# Patient Record
Sex: Male | Born: 1980 | Race: White | Hispanic: No | Marital: Single | Smoking: Never smoker
Health system: Southern US, Community
[De-identification: ages and names within clinical notes are randomized; demographics above are authoritative.]

## PROBLEM LIST (undated history)

## (undated) DIAGNOSIS — T7840XA Allergy, unspecified, initial encounter: Secondary | ICD-10-CM

## (undated) HISTORY — PX: CARPAL TUNNEL RELEASE: SHX101

## (undated) HISTORY — DX: Allergy, unspecified, initial encounter: T78.40XA

---

## 2007-04-19 ENCOUNTER — Inpatient Hospital Stay (HOSPITAL_COMMUNITY): Admission: EM | Admit: 2007-04-19 | Discharge: 2007-04-30 | Payer: Self-pay | Admitting: Emergency Medicine

## 2008-08-21 IMAGING — CT CT L SPINE W/O CM
4 of 6 series · 16 of 33 positions shown, 18 images · non-contrast
Comparison: Lumbar spine radiographs, 04/19/07.

CLINICAL DATA: Evaluate L1 compression fracture.
 LUMBAR SPINE CT WITHOUT CONTRAST ? 04/19/07:
TECHNIQUE: Multidetector CT imaging of the lumbar spine was performed.  Multiplanar CT image reconstructions were also generated.   Lumbar spine CT was performed from the inferior aspect of T11 through the superior aspect of L3, for the evaluation of an L1 fracture.

[Series 2: l-spine helical · axial · 0.27mm/px · z∈[-250,-162]mm · 5 of 53 slices shown, 7 images]
[im 9/53  soft-tissue]
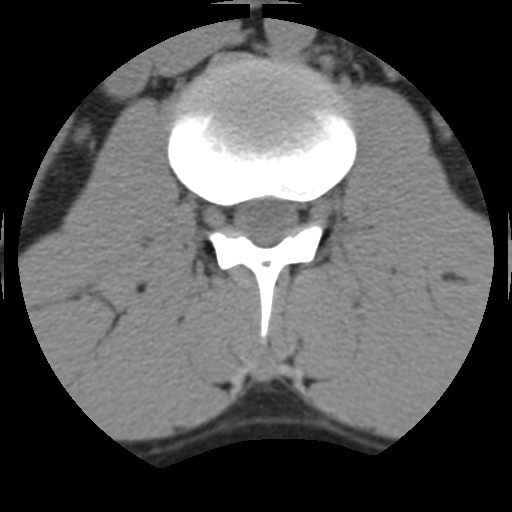
[im 9/53  bone]
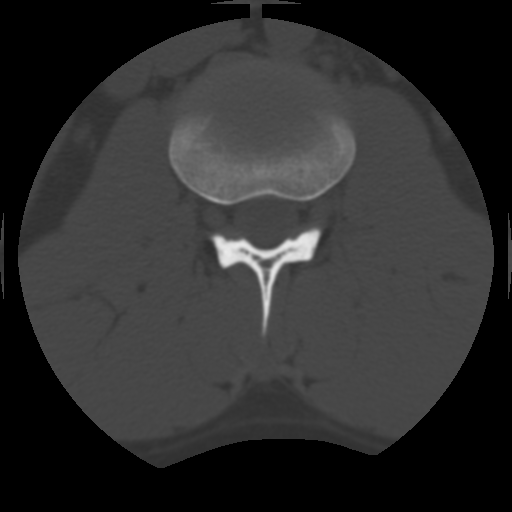
[im 18/53  bone]
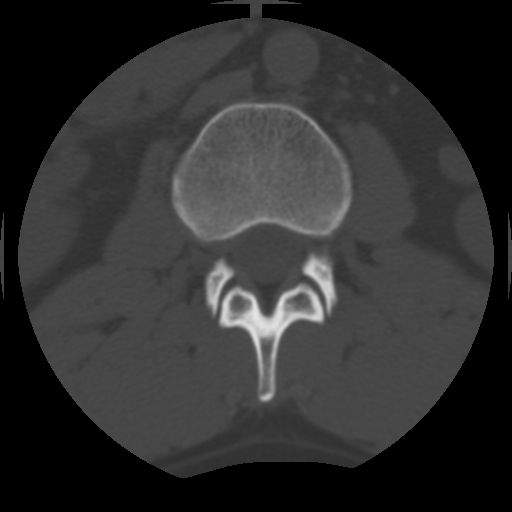
[im 27/53  bone]
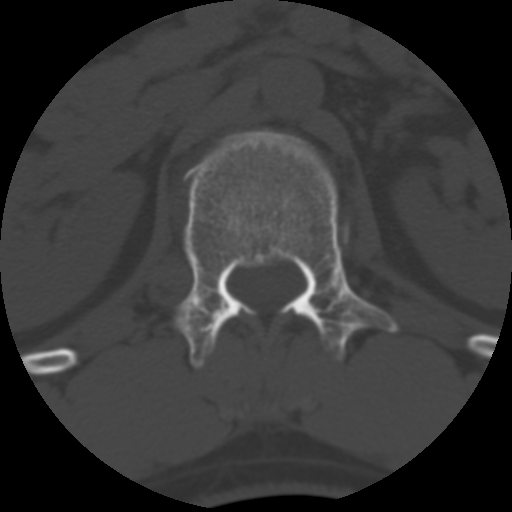
[im 35/53  bone]
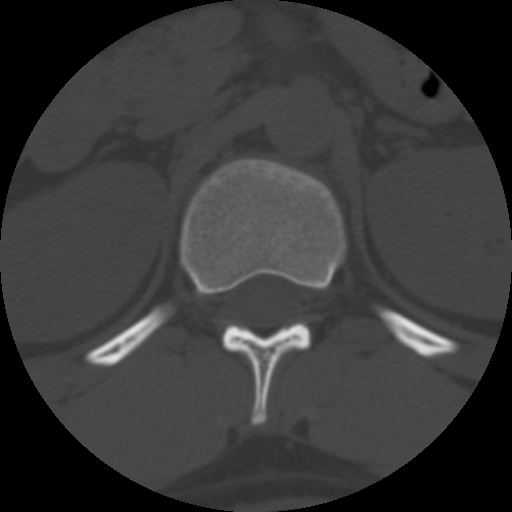
[im 44/53  soft-tissue]
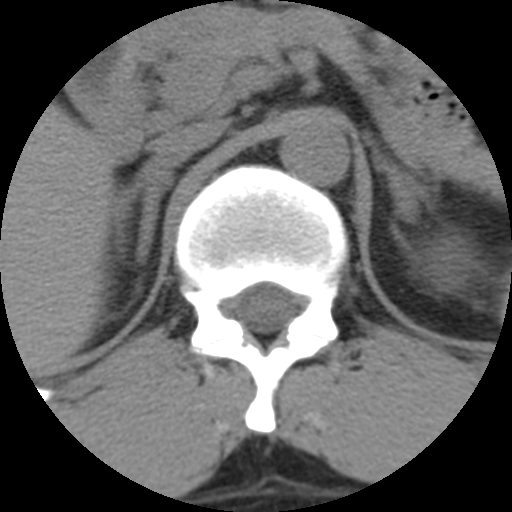
[im 44/53  bone]
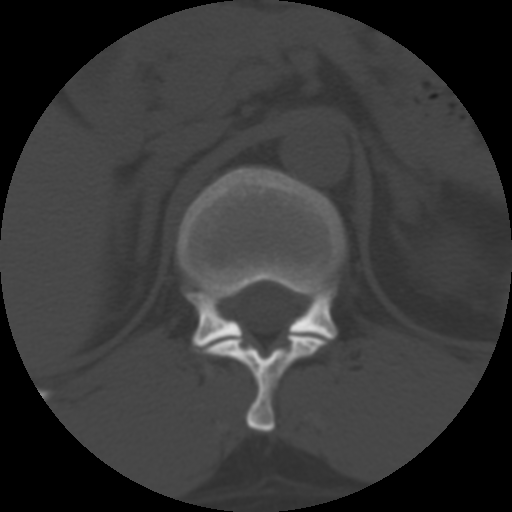

[Series 3: recon 2: l-spine helical · axial · 0.27mm/px · z∈[-250,-162]mm · 5 of 53 slices shown]
[im 9/53  bone]
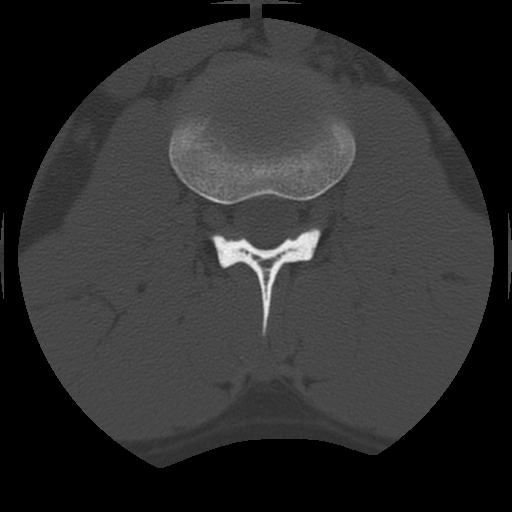
[im 18/53  bone]
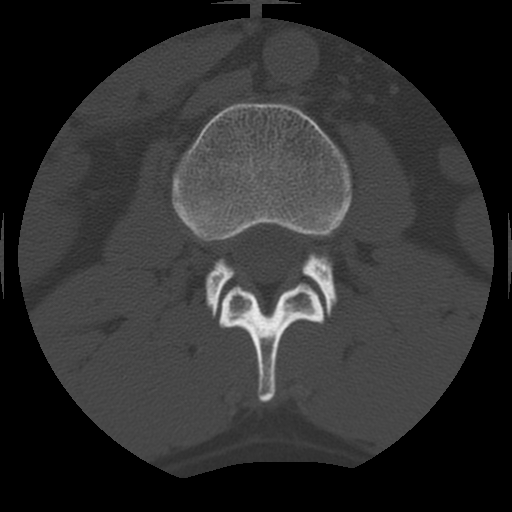
[im 27/53  bone]
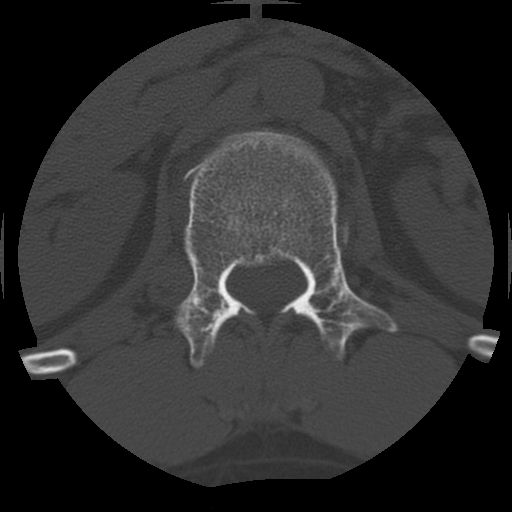
[im 35/53  bone]
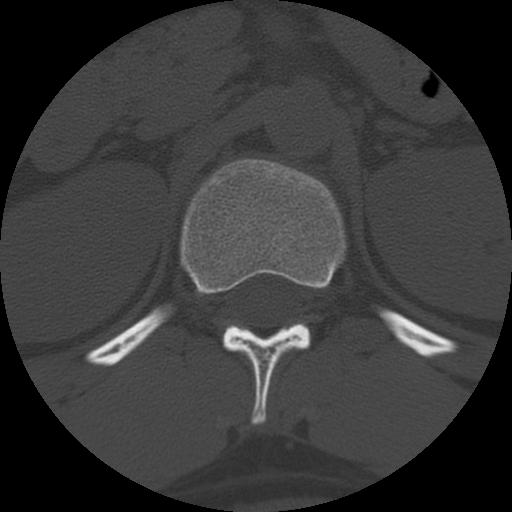
[im 44/53  bone]
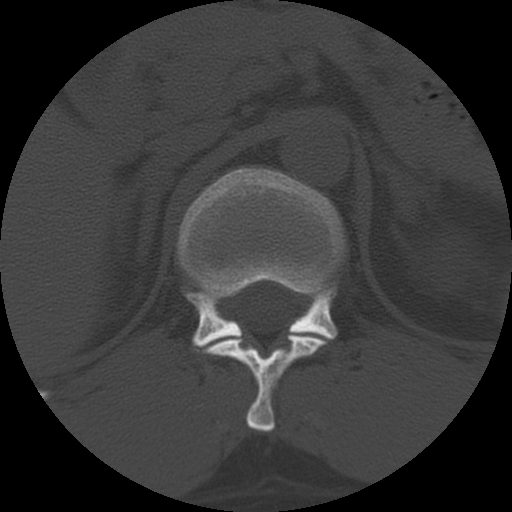

[Series 400: cor l-spine bone · coronal · 0.27mm/px · 1 of 46 slices shown]
[im 23/46  bone]
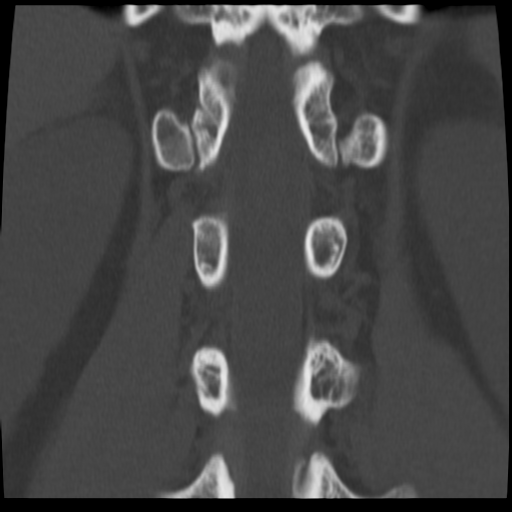

[Series 401: sag l-spine bone · sagittal · 0.27mm/px · 5 of 34 slices shown]
[im 6/34  bone]
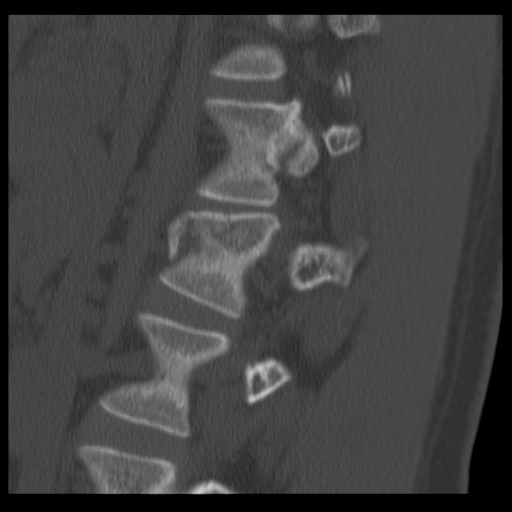
[im 12/34  bone]
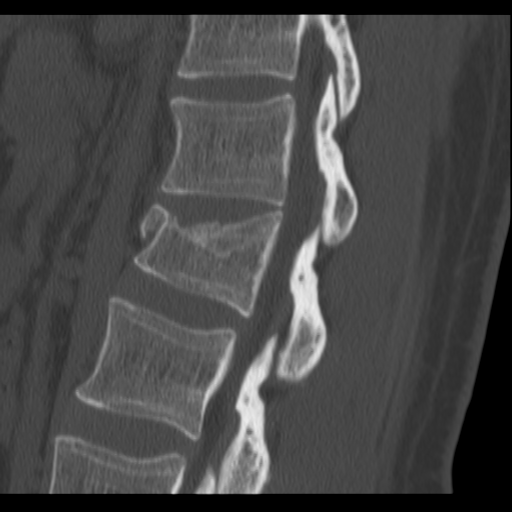
[im 17/34  bone]
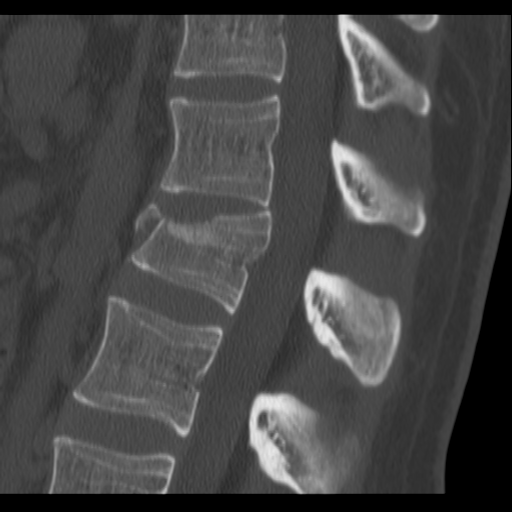
[im 23/34  bone]
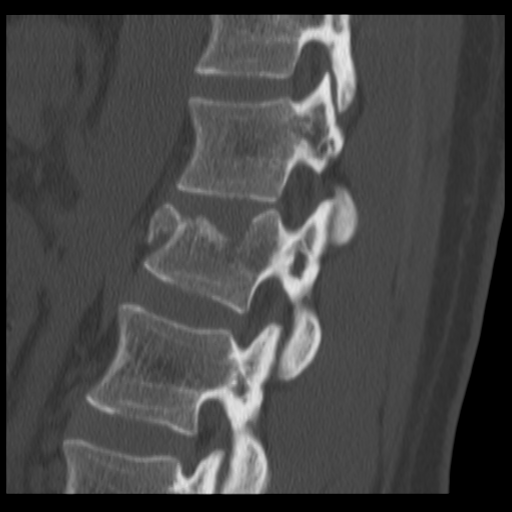
[im 28/34  bone]
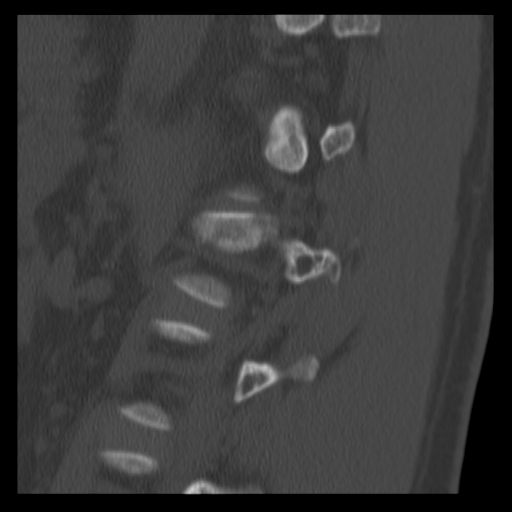

[16 of 33 positions shown; findings below may reference images not displayed]

FINDINGS: There is a comminuted fracture of the superior end-plate of L1. There is retropulsion of the posterior cortex of the L1 vertebral body, by approximately 3 mm.  The L1 vertebral body height anteriorly measures approximately 16 mm, compared to 25.5 mm of T12.  The fracture does not extend into the posterior elements of L1.
 On soft tissue windows, there is no evidence of an epidural hematoma.  No hematoma is identified in the soft tissues anterior to the L1 vertebral body.
IMPRESSION: Comminuted fracture of the superior end-plate of L1 as described above with 3 mm of retropulsion of the posterior cortex.

## 2010-08-22 NOTE — Discharge Summary (Signed)
NAME:  Oscar Le, Oscar Le NO.:  [id number]   MEDICAL RECORD NO.:  [id number]          PATIENT TYPE:  INP   LOCATION:  3005                         FACILITY:  [facility]   PHYSICIAN:  Hewitt Shorts, M.D.DATE OF BIRTH:  May 12, 1980   DATE OF ADMISSION:  04/19/2007  DATE OF DISCHARGE:  04/30/2007                               DISCHARGE SUMMARY   ADMISSION HISTORY AND PHYSICAL EXAMINATION:  The patient is a 30-year-  old man who fell from a ladder while power washing a house.  He had  immediate back pain.  He was evaluated in the [facility]  Emergency Room and found to have an L1 wedge compression fracture with  more loss of height anteriorly than posteriorly.  General examination  was notable only for tenderness in the upper lumbar region posteriorly.  Strength was at least 4 or 5 in the iliopsoas, but they gave away due to  pain.  The remainder of the strength of the upper and lower extremities  was 5/5.   HOSPITAL COURSE:  The patient was admitted, placed on a strict bedrest  with the head of bed flat and logrolling side to side every 2 hours.  He  was started on Lovenox 40 mg subcutaneous second day after injury.  He  had some difficulties with constipation that were treated with Dulcolax  tablets and Fleet Enema, and the constipation resolved.  After 1 week of  bedrest, we began to mobilize the patient.  He was fitted for a Jewett  brace, and instructions were given to the staff that he be donning and  doffing the brace in bed with the head of bed flat.  Physical therapy  was consulted and occupational therapy was consulted, and the patient  has made steady progress with mobility and activities.  He is at this  point able to don and doff the brace in bed.  He is much more  comfortable.  He is walking both on the floor as well as up steps.  He  does use a rolling walker, but I have explained to him that he needs to  taper and discontinue the use of rolling  walker within 1 week.  He has  been using medications including Percocet and Valium, and he has been  given prescriptions, but he has been told to taper the use of the  medications over the next week such that 1 week from now he is using  nothing more than Tylenol.  He has been given prescription of Percocet  with instructions to take 1/2 tablet to 1 tablet or 2 tablets every 4 to  6 hours p.r.n. for pain, 50 tablets prescribed, no refills.  He was  prescribed Valium 5 mg t.i.d. p.r.n. muscle spasms, 40 tablets and no  refills.  He was also instructed to take Caltrate 1 tablet b.i.d. to  help with the healing of his fracture.  He is to wear his Jewett brace  whenever he is up and about.  He is to steadily increase his activities.  He is to not flex in the  thoracolumbar region at any time.  He is to  follow up in my office in 3 weeks with AP and lateral lumbar spine x-  rays or sooner if he has increased difficulties.   DISCHARGE DIAGNOSIS:  L1 compression fracture, traumatic.      Hewitt Shorts, M.D.  Electronically Signed     RWN/MEDQ  D:  04/30/2007  T:  04/30/2007  Job:  [id number]
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
## 2010-12-28 LAB — CBC
HCT: 44.8
Hemoglobin: 15.2
MCHC: 33.9
MCV: 90.8
Platelets: 244
RBC: 4.93
RDW: 13.3
WBC: 18.1 — ABNORMAL HIGH

## 2010-12-28 LAB — DIFFERENTIAL
Basophils Absolute: 0.1
Basophils Relative: 1
Eosinophils Absolute: 0
Eosinophils Relative: 0
Lymphocytes Relative: 12
Lymphs Abs: 2.1
Monocytes Absolute: 1.3 — ABNORMAL HIGH
Monocytes Relative: 7
Neutro Abs: 14.6 — ABNORMAL HIGH
Neutrophils Relative %: 81 — ABNORMAL HIGH

## 2010-12-28 LAB — BASIC METABOLIC PANEL
BUN: 8
CO2: 25
Calcium: 9.2
Chloride: 110
Creatinine, Ser: 0.84
GFR calc non Af Amer: >60
Glucose, Bld: 95
Potassium: 3.6
Sodium: 144

## 2014-07-20 ENCOUNTER — Emergency Department (INDEPENDENT_AMBULATORY_CARE_PROVIDER_SITE_OTHER)
Admission: EM | Admit: 2014-07-20 | Discharge: 2014-07-20 | Disposition: A | Discharge status: Home / Self Care | Payer: Worker's Compensation | Source: Home / Self Care | Attending: Family Medicine | Admitting: Family Medicine

## 2014-07-20 ENCOUNTER — Encounter (HOSPITAL_COMMUNITY): Payer: Self-pay | Admitting: Emergency Medicine

## 2014-07-20 DIAGNOSIS — M5442 Lumbago with sciatica, left side: Secondary | ICD-10-CM

## 2014-07-20 MED ORDER — ETODOLAC 500 MG PO TABS: 500.0000 mg | ORAL_TABLET | Freq: Two times a day (BID) | ORAL | Status: DC

## 2014-07-20 MED ORDER — DIAZEPAM 5 MG PO TABS: 5.0000 mg | ORAL_TABLET | Freq: Two times a day (BID) | ORAL | Status: DC
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
## 2014-07-20 NOTE — Discharge Instructions (Signed)
Do not take the Valium while you're working.  Sciatica Sciatica is pain, weakness, numbness, or tingling along the path of the sciatic nerve. The nerve starts in the lower back and runs down the back of each leg. The nerve controls the muscles in the lower leg and in the back of the knee, while also providing sensation to the back of the thigh, lower leg, and the sole of your foot. Sciatica is a symptom of another medical condition. For instance, nerve damage or certain conditions, such as a herniated disk or bone spur on the spine, pinch or put pressure on the sciatic nerve. This causes the pain, weakness, or other sensations normally associated with sciatica. Generally, sciatica only affects one side of the body. CAUSES   Herniated or slipped disc.  Degenerative disk disease.  A pain disorder involving the narrow muscle in the buttocks (piriformis syndrome).  Pelvic injury or fracture.  Pregnancy.  Tumor (rare). SYMPTOMS  Symptoms can vary from mild to very severe. The symptoms usually travel from the low back to the buttocks and down the back of the leg. Symptoms can include:  Mild tingling or dull aches in the lower back, leg, or hip.  Numbness in the back of the calf or sole of the foot.  Burning sensations in the lower back, leg, or hip.  Sharp pains in the lower back, leg, or hip.  Leg weakness.  Severe back pain inhibiting movement. These symptoms may get worse with coughing, sneezing, laughing, or prolonged sitting or standing. Also, being overweight may worsen symptoms. DIAGNOSIS  Your caregiver will perform a physical exam to look for common symptoms of sciatica. He or she may ask you to do certain movements or activities that would trigger sciatic nerve pain. Other tests may be performed to find the cause of the sciatica. These may include:  Blood tests.  X-rays.  Imaging tests, such as an MRI or CT scan. TREATMENT  Treatment is directed at the cause of the  sciatic pain. Sometimes, treatment is not necessary and the pain and discomfort goes away on its own. If treatment is needed, your caregiver may suggest:  Over-the-counter medicines to relieve pain.  Prescription medicines, such as anti-inflammatory medicine, muscle relaxants, or narcotics.  Applying heat or ice to the painful area.  Steroid injections to lessen pain, irritation, and inflammation around the nerve.  Reducing activity during periods of pain.  Exercising and stretching to strengthen your abdomen and improve flexibility of your spine. Your caregiver may suggest losing weight if the extra weight makes the back pain worse.  Physical therapy.  Surgery to eliminate what is pressing or pinching the nerve, such as a bone spur or part of a herniated disk. HOME CARE INSTRUCTIONS   Only take over-the-counter or prescription medicines for pain or discomfort as directed by your caregiver.  Apply ice to the affected area for 20 minutes, 3-4 times a day for the first 48-72 hours. Then try heat in the same way.  Exercise, stretch, or perform your usual activities if these do not aggravate your pain.  Attend physical therapy sessions as directed by your caregiver.  Keep all follow-up appointments as directed by your caregiver.  Do not wear high heels or shoes that do not provide proper support.  Check your mattress to see if it is too soft. A firm mattress may lessen your pain and discomfort. SEEK IMMEDIATE MEDICAL CARE IF:   You lose control of your bowel or bladder (incontinence).  You  have increasing weakness in the lower back, pelvis, buttocks, or legs.  You have redness or swelling of your back.  You have a burning sensation when you urinate.  You have pain that gets worse when you lie down or awakens you at night.  Your pain is worse than you have experienced in the past.  Your pain is lasting longer than 4 weeks.  You are suddenly losing weight without  reason. MAKE SURE YOU:  Understand these instructions.  Will watch your condition.  Will get help right away if you are not doing well or get worse. Document Released: 03/20/2001 Document Revised: 09/25/2011 Document Reviewed: 08/05/2011 [facility] Patient Information 2015 [place], [place]. This information is not intended to replace advice given to you by your health care provider. Make sure you discuss any questions you have with your health care provider.  Back Exercises Back exercises help treat and prevent back injuries. The goal of back exercises is to increase the strength of your abdominal and back muscles and the flexibility of your back. These exercises should be started when you no longer have back pain. Back exercises include:  Pelvic Tilt. Lie on your back with your knees bent. Tilt your pelvis until the lower part of your back is against the floor. Hold this position 5 to 10 sec and repeat 5 to 10 times.  Knee to Chest. Pull first 1 knee up against your chest and hold for 20 to 30 seconds, repeat this with the other knee, and then both knees. This may be done with the other leg straight or bent, whichever feels better.  Sit-Ups or Curl-Ups. Bend your knees 90 degrees. Start with tilting your pelvis, and do a partial, slow sit-up, lifting your trunk only 30 to 45 degrees off the floor. Take at least 2 to 3 seconds for each sit-up. Do not do sit-ups with your knees out straight. If partial sit-ups are difficult, simply do the above but with only tightening your abdominal muscles and holding it as directed.  Hip-Lift. Lie on your back with your knees flexed 90 degrees. Push down with your feet and shoulders as you raise your hips a couple inches off the floor; hold for 10 seconds, repeat 5 to 10 times.  Back arches. Lie on your stomach, propping yourself up on bent elbows. Slowly press on your hands, causing an arch in your low back. Repeat 3 to 5 times. Any initial stiffness and  discomfort should lessen with repetition over time.  Shoulder-Lifts. Lie face down with arms beside your body. Keep hips and torso pressed to floor as you slowly lift your head and shoulders off the floor. Do not overdo your exercises, especially in the beginning. Exercises may cause you some mild back discomfort which lasts for a few minutes; however, if the pain is more severe, or lasts for more than 15 minutes, do not continue exercises until you see your caregiver. Improvement with exercise therapy for back problems is slow.  See your caregivers for assistance with developing a proper back exercise program. Document Released: 05/03/2004 Document Revised: 06/18/2011 Document Reviewed: 01/25/2011 [facility] Patient Information 2015 [place], [place]. This information is not intended to replace advice given to you by your health care provider. Make sure you discuss any questions you have with your health care provider.  Back Pain, Adult Back pain is very common. The pain often gets better over time. The cause of back pain is usually not dangerous. Most people can learn to manage their back  pain on their own.  HOME CARE   Stay active. Start with short walks on flat ground if you can. Try to walk farther each day.  Do not sit, drive, or stand in one place for more than 30 minutes. Do not stay in bed.  Do not avoid exercise or work. Activity can help your back heal faster.  Be careful when you bend or lift an object. Bend at your knees, keep the object close to you, and do not twist.  Sleep on a firm mattress. Lie on your side, and bend your knees. If you lie on your back, put a pillow under your knees.  Only take medicines as told by your doctor.  Put ice on the injured area.  Put ice in a plastic bag.  Place a towel between your skin and the bag.  Leave the ice on for 15-20 minutes, 03-04 times a day for the first 2 to 3 days. After that, you can switch between ice and heat packs.  Ask  your doctor about back exercises or massage.  Avoid feeling anxious or stressed. Find good ways to deal with stress, such as exercise. GET HELP RIGHT AWAY IF:   Your pain does not go away with rest or medicine.  Your pain does not go away in 1 week.  You have new problems.  You do not feel well.  The pain spreads into your legs.  You cannot control when you poop (bowel movement) or pee (urinate).  Your arms or legs feel weak or lose feeling (numbness).  You feel sick to your stomach (nauseous) or throw up (vomit).  You have belly (abdominal) pain.  You feel like you may pass out (faint). MAKE SURE YOU:   Understand these instructions.  Will watch your condition.  Will get help right away if you are not doing well or get worse. Document Released: 09/12/2007 Document Revised: 06/18/2011 Document Reviewed: 07/28/2013 [facility] Patient Information 2015 [place]. This information is not intended to replace advice given to you by your health care provider. Make sure you discuss any questions you have with your health care provider.
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
## 2021-11-17 ENCOUNTER — Encounter (HOSPITAL_BASED_OUTPATIENT_CLINIC_OR_DEPARTMENT_OTHER): Payer: Self-pay

## 2021-11-17 ENCOUNTER — Other Ambulatory Visit: Payer: Self-pay

## 2021-11-17 ENCOUNTER — Emergency Department (HOSPITAL_BASED_OUTPATIENT_CLINIC_OR_DEPARTMENT_OTHER)
Admission: EM | Admit: 2021-11-17 | Discharge: 2021-11-17 | Disposition: A | Discharge status: Home / Self Care | Payer: No Typology Code available for payment source | Attending: Emergency Medicine | Admitting: Emergency Medicine

## 2021-11-17 DIAGNOSIS — T7840XA Allergy, unspecified, initial encounter: Secondary | ICD-10-CM | POA: Diagnosis present

## 2021-11-17 MED ORDER — IBUPROFEN 400 MG PO TABS
600.0000 mg | ORAL_TABLET | Freq: Once | ORAL | Status: AC
  Administered 2021-11-17: 600 mg via ORAL
  Filled 2021-11-17: qty 1

## 2021-11-17 MED ORDER — EPINEPHRINE 0.3 MG/0.3ML IJ SOAJ: 0.3000 mg | INTRAMUSCULAR | 2 refills | Status: DC | PRN

## 2021-11-17 NOTE — ED Notes (Signed)
Discharge instructions and follow up care reviewed and explained. Pt verbalized understanding and states he has had training on using Epipen in the past and feels comfortable using an Epipen. Pt caox4 and ambulatory on d/c.

## 2021-11-17 NOTE — Discharge Instructions (Addendum)
You can continue taking Benadryl 25 mg every 6 hours as needed for the next 2 days for rash and swelling.  You can use ibuprofen and Tylenol for headache.  You can also apply ice to your forehead if this helps with the swelling and pain.  We talked about when to use the EpiPen in the future.  This would include when you have an allergic reaction, associated with lightheadedness, feeling like passing out, hives all over your body, or any throat or lip swelling or difficulty breathing.  If you are giving yourself this injection, you should also be returning to the ER immediately.

## 2021-11-17 NOTE — ED Provider Notes (Signed)
[facility] EMERGENCY DEPT Provider Note   CSN: [id number] Arrival date & time: 11/17/21  1158     History  Chief Complaint  Patient presents with   Insect Bite    Oscar Le is a 41 y.o. male presenting to emergency department with concern for allergic reaction to insect sting, reports he was stung by a hornet about 45 minutes before arrival on his forehead.  He reports he developed an swelling around the site, and also headache, but then his employer was concerned when the patient began to have nausea.  He was referred to the ED.  The patient ports has been stung in the past by yellow jackets bees, she developed localized swelling that can last a week or 2, but has never had throat swelling or lip swelling or anaphylaxis.  He does not carry an EpiPen.  He did take Benadryl prior to arrival.  HPI     Home Medications Prior to Admission medications   Medication Sig Start Date End Date Taking? Authorizing Provider  diazepam (VALIUM) 5 MG tablet Take 1 tablet (5 mg total) by mouth 2 (two) times daily. 07/20/14   Excell Seltzer, Adrian Blackwater, PA-C  diphenhydrAMINE (SOMINEX) 25 MG tablet Take 25 mg by mouth at bedtime as needed for allergies or sleep.    [provider]  etodolac (LODINE) 500 MG tablet Take 1 tablet (500 mg total) by mouth 2 (two) times daily. 07/20/14   Graylon Good, PA-C      Allergies    Codeine    Review of Systems   Review of Systems  Physical Exam Updated Vital Signs BP (!) 113/98 (BP Location: Right Arm)   Pulse 84   Temp 98.1 F (36.7 C) (Oral)   Resp 16   Ht 5\' 10"  (1.778 m)   Wt 87.1 kg   SpO2 100%   BMI 27.55 kg/m  Physical Exam Constitutional:      General: He is not in acute distress.    Comments: Small amount of erythema surrounding frontal forehead sting site Patient has red birth mark beneath right eye  HENT:     Head: Normocephalic and atraumatic.  Eyes:     Conjunctiva/sclera: Conjunctivae normal.     Pupils: Pupils  are equal, round, and reactive to light.  Neck:     Comments: Oropharynx non-erythematous.  No tonsillar swelling or exudate.  No uvular deviation.  No drooling. No brawny edema. No stridor. Voice is not muffled.  Cardiovascular:     Rate and Rhythm: Normal rate and regular rhythm.  Pulmonary:     Effort: Pulmonary effort is normal. No respiratory distress.  Abdominal:     General: There is no distension.     Tenderness: There is no abdominal tenderness.  Skin:    General: Skin is warm and dry.  Neurological:     General: No focal deficit present.     Mental Status: He is alert. Mental status is at baseline.  Psychiatric:        Mood and Affect: Mood normal.        Behavior: Behavior normal.     ED Results / Procedures / Treatments   Labs (all labs ordered are listed, but only abnormal results are displayed) Labs Reviewed - No data to display  EKG None  Radiology No results found.  Procedures Procedures    Medications Ordered in ED Medications - No data to display  ED Course/ Medical Decision Making/ A&P  Medical Decision Making  Patient is here with concern for allergic reaction to bee sting.  He does report nausea and vomiting in association with his skin swelling, which would meet the requirements for anaphylaxis.  He does not have hypotension or evidence of airway involvement.  I do think it be reasonable to prescribe him an EpiPen and discussed the circumstances that he would want to use this, and he will also be referred back to the allergy testing center to determine the extent of his allergies.  It is possible to develop hypersensitivity to bee venom after multiple stings.  I recommend he continue Benadryl in the meantime at home.  He was observed for period of 2 and half hours in the ED with absolutely no worsening of his symptoms, no evidence of developing hives, no airway involvement.  I felt he was safe for discharge at this  time        Final Clinical Impression(s) / ED Diagnoses Final diagnoses:  Allergic reaction, initial encounter    Rx / DC Orders ED Discharge Orders     None         Lizzie An, Kermit Balo, MD 11/17/21 [phone]

## 2021-11-17 NOTE — ED Triage Notes (Signed)
Patient here POV from Home.  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   No SOB. Discomfort to Area as well as Nausea.   NAD Noted during Triage. A&Ox4. GCS 15. Ambulatory.

## 2022-12-24 ENCOUNTER — Ambulatory Visit
Admission: EM | Admit: 2022-12-24 | Discharge: 2022-12-24 | Disposition: A | Discharge status: Home / Self Care | Payer: Self-pay | Attending: Internal Medicine | Admitting: Internal Medicine

## 2022-12-24 DIAGNOSIS — R051 Acute cough: Secondary | ICD-10-CM | POA: Insufficient documentation

## 2022-12-24 DIAGNOSIS — J208 Acute bronchitis due to other specified organisms: Secondary | ICD-10-CM | POA: Insufficient documentation

## 2022-12-24 DIAGNOSIS — Z1152 Encounter for screening for COVID-19: Secondary | ICD-10-CM | POA: Insufficient documentation

## 2022-12-24 DIAGNOSIS — B9689 Other specified bacterial agents as the cause of diseases classified elsewhere: Secondary | ICD-10-CM | POA: Insufficient documentation

## 2022-12-24 MED ORDER — AZITHROMYCIN 250 MG PO TABS: 250.0000 mg | ORAL_TABLET | Freq: Every day | ORAL | 0 refills | Status: DC

## 2022-12-24 MED ORDER — ALBUTEROL SULFATE HFA 108 (90 BASE) MCG/ACT IN AERS: 1.0000 | INHALATION_SPRAY | Freq: Four times a day (QID) | RESPIRATORY_TRACT | 0 refills | Status: AC | PRN

## 2022-12-24 NOTE — ED Triage Notes (Signed)
Pt presents with c/o chest congestion after cutting bushes recently. 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

## 2022-12-24 NOTE — Discharge Instructions (Addendum)
The clinic will contact you with results of your COVID test done today if positive.  Start albuterol inhaler as needed for shortness of breath. Zithromax as prescribed. Continue over the counter Mucinex or dayquil.  Lots of rest and fluids.  Please follow-up with your PCP in 2 days for recheck.  Please go to the ER for any worsening symptoms.  I hope you feel better soon!

## 2022-12-24 NOTE — ED Provider Notes (Signed)
[facility]    CSN: [id number] Arrival date & time: 12/24/22  1059      History   Chief Complaint Chief Complaint  Patient presents with   chest congestion    HPI Oscar Le is a 42 y.o. male  presents for evaluation of URI symptoms for 3 days. Patient reports associated symptoms of cough, congestion, shortness of breath. Denies N/V/D, fevers, sore throat, ear pain, body aches. Patient does not have a hx of asthma. Patient does not have a history of smoking.  Reports no sick contacts.  States he had similar symptoms a couple weeks ago that nearly resolved but then worsened again.  Pt has taken Mucinex and DayQuil OTC for symptoms. Pt has no other concerns at this time.   HPI  History reviewed. No pertinent past medical history.  There are no problems to display for this patient.   History reviewed. No pertinent surgical history.     Home Medications    Prior to Admission medications   Medication Sig Start Date End Date Taking? Authorizing Provider  albuterol (VENTOLIN HFA) 108 (90 Base) MCG/ACT inhaler Inhale 1-2 puffs into the lungs every 6 (six) hours as needed for wheezing or shortness of breath. 12/24/22  Yes Radford Pax, NP  azithromycin (ZITHROMAX) 250 MG tablet Take 1 tablet (250 mg total) by mouth daily. Take first 2 tablets together, then 1 every day until finished. 12/24/22  Yes Radford Pax, NP  diazepam (VALIUM) 5 MG tablet Take 1 tablet (5 mg total) by mouth 2 (two) times daily. 07/20/14   Excell Seltzer, Adrian Blackwater, PA-C  diphenhydrAMINE (SOMINEX) 25 MG tablet Take 25 mg by mouth at bedtime as needed for allergies or sleep.    [provider]  EPINEPHrine 0.3 mg/0.3 mL IJ SOAJ injection Inject 0.3 mg into the muscle as needed for anaphylaxis. 11/17/21   Terald Sleeper, MD  etodolac (LODINE) 500 MG tablet Take 1 tablet (500 mg total) by mouth 2 (two) times daily. 07/20/14   Graylon Good, PA-C    Family History History reviewed. No pertinent  family history.  Social History Social History   Tobacco Use   Smoking status: Never   Smokeless tobacco: Never  Substance Use Topics   Alcohol use: No   Drug use: No     Allergies   Codeine   Review of Systems Review of Systems  HENT:  Positive for congestion.   Respiratory:  Positive for cough and shortness of breath.      Physical Exam Triage Vital Signs ED Triage Vitals [12/24/22 1120]  Encounter Vitals Group     BP (!) 141/91     Systolic BP Percentile      Diastolic BP Percentile      Pulse Rate 93     Resp 18     Temp 98.1 F (36.7 C)     Temp Source Oral     SpO2 94 %     Weight      Height      Head Circumference      Peak Flow      Pain Score 5     Pain Loc      Pain Education      Exclude from Growth Chart    No data found.  Updated Vital Signs BP (!) 141/91 (BP Location: Right Arm)   Pulse 93   Temp 98.1 F (36.7 C) (Oral)   Resp 18   SpO2 94%  Visual Acuity Right Eye Distance:   Left Eye Distance:   Bilateral Distance:    Right Eye Near:   Left Eye Near:    Bilateral Near:     Physical Exam Vitals and nursing note reviewed.  Constitutional:      General: He is not in acute distress.    Appearance: Normal appearance. He is not ill-appearing or toxic-appearing.  HENT:     Head: Normocephalic and atraumatic.     Right Ear: Tympanic membrane and ear canal normal.     Left Ear: Tympanic membrane and ear canal normal.     Nose: Congestion present.     Mouth/Throat:     Mouth: Mucous membranes are moist.     Pharynx: No posterior oropharyngeal erythema.  Eyes:     Pupils: Pupils are equal, round, and reactive to light.  Cardiovascular:     Rate and Rhythm: Normal rate and regular rhythm.     Heart sounds: Normal heart sounds.  Pulmonary:     Effort: Pulmonary effort is normal.     Breath sounds: Rhonchi present.     Comments: Rhonchi RLL Musculoskeletal:     Cervical back: Normal range of motion and neck supple.   Lymphadenopathy:     Cervical: No cervical adenopathy.  Skin:    General: Skin is warm and dry.  Neurological:     General: No focal deficit present.     Mental Status: He is alert and oriented to person, place, and time.  Psychiatric:        Mood and Affect: Mood normal.        Behavior: Behavior normal.      UC Treatments / Results  Labs (all labs ordered are listed, but only abnormal results are displayed) Labs Reviewed  SARS CORONAVIRUS 2 (TAT 6-24 HRS)    EKG   Radiology No results found.  Procedures Procedures (including critical care time)  Medications Ordered in UC Medications - No data to display  Initial Impression / Assessment and Plan / UC Course  I have reviewed the triage vital signs and the nursing notes.  Pertinent labs & imaging results that were available during my care of the patient were reviewed by me and considered in my medical decision making (see chart for details).     Reviewed exam and sx with patient.  COVID PCR and will contact if positive.  Patient with worsening cough.  Given exam and symptoms we will start Zithromax, albuterol inhaler.  Patient prefers to continue OTC cough medicine as needed.  PCP follow-up 2 days for recheck.  ER precautions reviewed and patient verbalized understanding. Final Clinical Impressions(s) / UC Diagnoses   Final diagnoses:  Acute cough  Acute bacterial bronchitis     Discharge Instructions      The clinic will contact you with results of your COVID test done today if positive.  Start albuterol inhaler as needed for shortness of breath. Zithromax as prescribed. Continue over the counter Mucinex or dayquil.  Lots of rest and fluids.  Please follow-up with your PCP in 2 days for recheck.  Please go to the ER for any worsening symptoms.  I hope you feel better soon!     ED Prescriptions     Medication Sig Dispense Auth. Provider   albuterol (VENTOLIN HFA) 108 (90 Base) MCG/ACT inhaler Inhale 1-2  puffs into the lungs every 6 (six) hours as needed for wheezing or shortness of breath. 1 each Radford Pax, NP  azithromycin (ZITHROMAX) 250 MG tablet Take 1 tablet (250 mg total) by mouth daily. Take first 2 tablets together, then 1 every day until finished. 6 tablet Radford Pax, NP      PDMP not reviewed this encounter.   Radford Pax, NP 12/24/22 [phone]

## 2022-12-25 LAB — SARS CORONAVIRUS 2 (TAT 6-24 HRS): SARS Coronavirus 2: NEGATIVE

## 2023-07-31 ENCOUNTER — Emergency Department: Payer: Worker's Compensation

## 2023-07-31 ENCOUNTER — Emergency Department
Admission: EM | Admit: 2023-07-31 | Discharge: 2023-07-31 | Disposition: A | Discharge status: Home / Self Care | Payer: Worker's Compensation | Attending: Emergency Medicine | Admitting: Emergency Medicine

## 2023-07-31 DIAGNOSIS — S8992XA Unspecified injury of left lower leg, initial encounter: Secondary | ICD-10-CM | POA: Diagnosis present

## 2023-07-31 DIAGNOSIS — S99912A Unspecified injury of left ankle, initial encounter: Secondary | ICD-10-CM | POA: Insufficient documentation

## 2023-07-31 DIAGNOSIS — W010XXA Fall on same level from slipping, tripping and stumbling without subsequent striking against object, initial encounter: Secondary | ICD-10-CM | POA: Diagnosis not present

## 2023-07-31 DIAGNOSIS — W19XXXA Unspecified fall, initial encounter: Secondary | ICD-10-CM

## 2023-07-31 MED ORDER — KETOROLAC TROMETHAMINE 30 MG/ML IJ SOLN
30.0000 mg | Freq: Once | INTRAMUSCULAR | Status: AC
  Administered 2023-07-31: 30 mg via INTRAVENOUS
  Filled 2023-07-31: qty 1

## 2023-07-31 MED ORDER — KETOROLAC TROMETHAMINE 10 MG PO TABS: 10.0000 mg | ORAL_TABLET | Freq: Four times a day (QID) | ORAL | 0 refills | Status: DC | PRN

## 2023-07-31 NOTE — Discharge Instructions (Addendum)
 You have been diagnosed with soft tissue injury of left knee, injury of left ankle.  X-ray negative for fracture.  Please take Toradol  1 tablet by mouth every 6 hours as needed for pain.  Please apply ice and elevate the left lower extremity.  Please call Dr. Daun Epstein and make an appointment for a follow-up.  Please come back to ED or go to your PCP if you have new symptoms or symptoms worsen.

## 2023-07-31 NOTE — ED Triage Notes (Signed)
 Pt presents to the ED POV from work. Pt reports tripping and falling down a hill at work. Reports his left leg going up underneath him. Pt states that he is unable to put pressure on the left leg. Reports pain in his left knee and left lower leg.

## 2023-07-31 NOTE — ED Provider Notes (Signed)
 [facility] Provider Note    Event Date/Time   First MD Initiated Contact with Patient 07/31/23 [phone]     (approximate)   History   Fall    HPI  Oscar Le is a 43 y.o. male   with no significant past medical history who presents to the ED complaining of fall 2 hours ago.    According to the patient, he tripped and fall down the hill.  Patient states his left knee bent.  Patient is unable to wear weight, pain on his left ankle and knee.  Patient states did not hear any pop.       Physical Exam   Triage Vital Signs: ED Triage Vitals  Encounter Vitals Group     BP 07/31/23 1521 134/84     Systolic BP Percentile --      Diastolic BP Percentile --      Pulse Rate 07/31/23 1521 90     Resp 07/31/23 1521 18     Temp 07/31/23 1521 98.7 F (37.1 C)     Temp Source 07/31/23 1521 Oral     SpO2 07/31/23 1521 100 %     Weight 07/31/23 1524 200 lb (90.7 kg)     Height 07/31/23 1524 5\' 10"  (1.778 m)     Head Circumference --      Peak Flow --      Pain Score 07/31/23 1523 6     Pain Loc --      Pain Education --      Exclude from Growth Chart --     Most recent vital signs: Vitals:   07/31/23 1521  BP: 134/84  Pulse: 90  Resp: 18  Temp: 98.7 F (37.1 C)  SpO2: 100%     Constitutional: Alert, NAD. Able to speak in complete sentences without cough or dyspnea  Eyes: Conjunctivae are normal.  Head: Atraumatic. Nose: No congestion/rhinnorhea. Mouth/Throat: Mucous membranes are moist.   Neck: Painless ROM. Supple. No JVD, nodes, thyromegaly  Cardiovascular:   Good peripheral circulation.RRR no murmurs, gallops, rubs  Respiratory: Normal respiratory effort.  No retractions. Clear to auscultation bilaterally without wheezing or crackles  Gastrointestinal: Soft and nontender.  Musculoskeletal:  no deformity Left lower extremity: Skin is intact, no ecchymosis no hematomas.  Knee: Mild prepatellar edema, tender to palpation at the level of the  patella, ROM limited by pain.  Flexion is limited.  Left ankle: Skin is intact, no ecchymosis no hematomas, pulses positive.  Tender to palpation at bilateral malleolar area.  Thompson test negative for tendon rupture. Neurologic:  MAE spontaneously. No gross focal neurologic deficits are appreciated.  Skin:  Skin is warm, dry and intact. No rash noted. Psychiatric: Mood and affect are normal. Speech and behavior are normal.    ED Results / Procedures / Treatments   Labs (all labs ordered are listed, but only abnormal results are displayed) Labs Reviewed - No data to display   EKG    RADIOLOGY I independently reviewed and interpreted imaging and agree with radiologists findings.      PROCEDURES:  Critical Care performed:   Procedures   MEDICATIONS ORDERED IN ED: Medications  ketorolac  (TORADOL ) 30 MG/ML injection 30 mg (30 mg Intravenous Given 07/31/23 1635)   Clinical Course as of 07/31/23 1647  Wed Jul 31, 2023  1621 DG Tibia/Fibula Left Negative. [AE]    Clinical Course User Index [AE] Awilda Lennox, PA-C    IMPRESSION / MDM / ASSESSMENT AND PLAN /  ED COURSE  I reviewed the triage vital signs and the nursing notes.  Differential diagnosis includes, but is not limited to, fracture, dislocation, tendon injury, effusion.  Patient's presentation is most consistent with acute complicated illness / injury requiring diagnostic workup.   Patient's diagnosis is consistent with left knee  soft tissue injury. I independently reviewed and interpreted imaging and agree with radiologists findings fracture. I did review the patient's allergies and medications.Patient is allergic to codeine, patient received Toradol  during admission for pain.  Explained to the patient the need an MRI to rule out tendons or ligament injury. The patient is in stable and satisfactory condition for discharge home .  Patient is going to be discharged with knee immobilization and crutches. Patient  will be discharged home with prescriptions for Toradol . Patient is to follow up with orthopedics as needed or otherwise directed. Patient is given ED precautions to return to the ED for any worsening or new symptoms. Discussed plan of care with patient, answered all of patient's questions, Patient agreeable to plan of care. Advised patient to take medications according to the instructions on the label. Discussed possible side effects of new medications. Patient verbalized understanding.    FINAL CLINICAL IMPRESSION(S) / ED DIAGNOSES   Final diagnoses:  Soft tissue injury of left knee, initial encounter  Injury of left ankle, initial encounter  Fall, initial encounter     Rx / DC Orders   ED Discharge Orders          Ordered    ketorolac  (TORADOL ) 10 MG tablet  Every 6 hours PRN       Note to Pharmacy: Patient given an IM/IV loading dose in emergency department   07/31/23 1643             Note:  This document was prepared using Dragon voice recognition software and may include unintentional dictation errors.   Awilda Lennox, PA-C 07/31/23 1647    Lind Repine, MD 07/31/23 2132

## 2023-12-03 ENCOUNTER — Encounter: Payer: Self-pay | Admitting: Family Medicine

## 2023-12-03 ENCOUNTER — Ambulatory Visit (INDEPENDENT_AMBULATORY_CARE_PROVIDER_SITE_OTHER): Payer: Self-pay | Admitting: Family Medicine

## 2023-12-03 ENCOUNTER — Encounter (INDEPENDENT_AMBULATORY_CARE_PROVIDER_SITE_OTHER): Payer: Self-pay | Admitting: *Deleted

## 2023-12-03 VITALS — BP 129/83 | HR 77 | Temp 98.2°F | Resp 18 | Ht 70.0 in | Wt 215.0 lb

## 2023-12-03 DIAGNOSIS — K529 Noninfective gastroenteritis and colitis, unspecified: Secondary | ICD-10-CM | POA: Diagnosis not present

## 2023-12-03 DIAGNOSIS — Z7689 Persons encountering health services in other specified circumstances: Secondary | ICD-10-CM

## 2023-12-03 DIAGNOSIS — Z833 Family history of diabetes mellitus: Secondary | ICD-10-CM | POA: Diagnosis not present

## 2023-12-03 LAB — POCT GLYCOSYLATED HEMOGLOBIN (HGB A1C): Hemoglobin A1C: 5.6 % (ref 4.0–5.6)

## 2023-12-03 MED ORDER — EPINEPHRINE 0.3 MG/0.3ML IJ SOAJ: 0.3000 mg | INTRAMUSCULAR | 2 refills | Status: AC | PRN

## 2023-12-03 NOTE — Progress Notes (Signed)
 New Patient Office Visit  Subjective   Patient ID: Oscar Le, male    DOB: 08/18/1980  Age: 43 y.o. MRN: [id number]  CC:  Chief Complaint  Patient presents with   Establish Care   Bloated        Diarrhea   Discussed the use of AI scribe software for clinical note transcription with the patient, who gave verbal consent to proceed.  History of Present Illness   Dennard Vezina is a 43 year old male who presents to establish with [facility] and has concerns about chronic bloating and diarrhea.  He has experienced severe bloating and diarrhea for approximately eight years. The bloating occurs within ten minutes after eating, making him appear 'like an eight-month pregnant woman' and is accompanied by difficulty breathing deeply. Diarrhea occurs shortly after eating, regardless of food type, and is severe enough to impact his work, sometimes requiring him to leave to use the bathroom. Foods like apples and peanut butter crackers, which previously helped him regain strength and prevent diarrhea, now exacerbate his symptoms.  His symptoms began shortly after a back injury in 2008, which did not require surgery but resulted in a year and a half of recovery. He experiences nausea and weakness if he does not eat shortly after waking up at 5:30 AM. After eating, he often needs to 'physically run' to the bathroom within ten minutes. Sugary drinks and alcohol, such as margaritas, cause nausea and headaches.  No blood in stool, excessive urination, or significant changes in thirst, although he drinks a lot of sugar-free Gatorade at night and experiences headaches. He experiences sweating, particularly on his face, but not to the extent of soaking through his shirt. 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 He uses albuterol  as needed for seasonal allergies and carries an EpiPen  for bee allergies. He has not seen a primary care doctor (Dr. Roetta in [place], [place]) in about a year and does not currently see a neurologist for his back issues. He has a family history of diabetes and cancer, with his mother having had breast and ovarian cancer.         12/03/2023    8:55 AM  GAD 7 : Generalized Anxiety Score  Nervous, Anxious, on Edge 0  Control/stop worrying 0  Worry too much - different things 1  Trouble relaxing 1  Restless 0  Easily annoyed or irritable 0  Afraid - awful might happen 0  Total GAD 7 Score 2  Anxiety Difficulty Not difficult at all       12/03/2023    8:55 AM  PHQ9 SCORE ONLY  PHQ-9 Total Score 0    Outpatient Encounter Medications as of 12/03/2023  Medication Sig   albuterol  (VENTOLIN  HFA) 108 (90 Base) MCG/ACT inhaler Inhale 1-2 puffs into the lungs every 6 (six) hours as needed for wheezing or shortness of breath.   [DISCONTINUED] EPINEPHrine  0.3 mg/0.3 mL IJ SOAJ injection Inject 0.3 mg into the muscle as needed for anaphylaxis.   [DISCONTINUED] HYDROcodone-acetaminophen (NORCO/VICODIN) 5-325 MG tablet 1 tablet.   EPINEPHrine  0.3 mg/0.3 mL IJ SOAJ injection Inject 0.3 mg into the muscle as needed for anaphylaxis.   [DISCONTINUED] azithromycin  (ZITHROMAX ) 250 MG tablet Take 1 tablet (250 mg total) by  mouth daily. Take first 2 tablets together, then 1 every day until finished.   [DISCONTINUED] diazepam  (VALIUM ) 5 MG tablet Take 1 tablet (5 mg total) by mouth 2 (two) times daily.   [DISCONTINUED] diphenhydrAMINE (SOMINEX) 25 MG tablet Take 25 mg by mouth at bedtime as needed for allergies or sleep.   [DISCONTINUED] ketorolac  (TORADOL ) 10 MG tablet Take 1 tablet (10 mg total) by mouth every 6 (six) hours as needed.   No facility-administered encounter medications on file as of 12/03/2023.    There are no active problems to display for  this patient.  Past Medical History:  Diagnosis Date   Allergy Born   Seasonal allergies   Past Surgical History:  Procedure Laterality Date   CARPAL TUNNEL RELEASE Right    Family History  Problem Relation Age of Onset   Cancer Mother        breast & ovarian   Diabetes Mother    Arthritis Father    Social History   Socioeconomic History   Marital status: Single    Spouse name: Not on file   Number of children: Not on file   Years of education: Not on file   Highest education level: 12th grade  Occupational History   Not on file  Tobacco Use   Smoking status: Former    Current packs/day: 0.25    Average packs/day: 0.3 packs/day for 5.0 years (1.3 ttl pk-yrs)    Types: Cigarettes, Cigars    Passive exposure: Past   Smokeless tobacco: Never  Substance and Sexual Activity   Alcohol use: Yes    Comment: 2beer a week   Drug use: No   Sexual activity: Not Currently    Birth control/protection: Condom  Other Topics Concern   Not on file  Social History Narrative   Not on file   Social Drivers of Health   Financial Resource Strain: Low Risk  (12/02/2023)   Overall Financial Resource Strain (CARDIA)    Difficulty of Paying Living Expenses: Not very hard  Food Insecurity: No Food Insecurity (12/02/2023)   Hunger Vital Sign    Worried About Running Out of Food in the Last Year: Never true    Ran Out of Food in the Last Year: Never true  Transportation Needs: No Transportation Needs (12/02/2023)   PRAPARE - Administrator, Civil Service (Medical): No    Lack of Transportation (Non-Medical): No  Physical Activity: Sufficiently Active (12/02/2023)   Exercise Vital Sign    Days of Exercise per Week: 5 days    Minutes of Exercise per Session: 30 min  Stress: No Stress Concern Present (12/02/2023)   [employer] of Occupational [employer] - Occupational Stress Questionnaire    Feeling of Stress: Only a little  Social Connections: Not on file  Intimate Partner  Violence: Not on file   Outpatient Medications Prior to Visit  Medication Sig Dispense Refill   albuterol  (VENTOLIN  HFA) 108 (90 Base) MCG/ACT inhaler Inhale 1-2 puffs into the lungs every 6 (six) hours as needed for wheezing or shortness of breath. 1 each 0   EPINEPHrine  0.3 mg/0.3 mL IJ SOAJ injection Inject 0.3 mg into the muscle as needed for anaphylaxis. 1 each 2   HYDROcodone-acetaminophen (NORCO/VICODIN) 5-325 MG tablet 1 tablet.     azithromycin  (ZITHROMAX ) 250 MG tablet Take 1 tablet (250 mg total) by mouth daily. Take first 2 tablets together, then 1 every day until finished. 6 tablet 0   diazepam  (VALIUM ) 5 MG tablet  Take 1 tablet (5 mg total) by mouth 2 (two) times daily. 10 tablet 0   diphenhydrAMINE (SOMINEX) 25 MG tablet Take 25 mg by mouth at bedtime as needed for allergies or sleep.     ketorolac  (TORADOL ) 10 MG tablet Take 1 tablet (10 mg total) by mouth every 6 (six) hours as needed. 20 tablet 0   No facility-administered medications prior to visit.   Allergies  Allergen Reactions   Codeine Nausea Only   ROS: see HPI    Objective   Today's Vitals   12/03/23 0850  BP: 129/83  Pulse: 77  Resp: 18  Temp: 98.2 F (36.8 C)  TempSrc: Oral  SpO2: 96%  Weight: 215 lb (97.5 kg)  Height: 5' 10 (1.778 m)  PainSc: 0-No pain    GENERAL: Well-appearing, in NAD. Well nourished.  SKIN: Pink, warm and dry. No rash, lesion, ulceration, or ecchymoses.  Head: Normocephalic. NECK: Trachea midline. Full ROM w/o pain or tenderness. No lymphadenopathy.  EARS: Tympanic membranes are intact, translucent without bulging and without drainage. Appropriate landmarks visualized.  EYES: Conjunctiva clear without exudates. EOMI, PERRL, no drainage present.  NOSE: Septum midline w/o deformity. Nares patent, mucosa pink and non-inflamed w/o drainage. No sinus tenderness.  THROAT: Uvula midline. Oropharynx clear. Tonsils non-inflamed without exudate. Mucous membranes pink and moist.   RESPIRATORY: Chest wall symmetrical. Respirations even and non-labored. Breath sounds clear to auscultation bilaterally.  CARDIAC: S1, S2 present, regular rate and rhythm without murmur or gallops. Peripheral pulses 2+ bilaterally.  MSK: Muscle tone and strength appropriate for age. Joints w/o tenderness, redness, or swelling.  EXTREMITIES: Without clubbing, cyanosis, or edema.  NEUROLOGIC: No motor or sensory deficits. Steady, even gait. C2-C12 intact.  PSYCH/MENTAL STATUS: Alert, oriented x 3. Cooperative, appropriate mood and affect.     Assessment & Plan:   1. Encounter to establish care (Primary) Patient is a 43 year old male male who presents today to establish care with primary care at [facility]. Reviewed the past medical history, family history, social history, surgical history, medications and allergies today- updates made as indicated. Patient has concerns today about his blood sugar and chronic diarrhea.    2. Family history of diabetes mellitus (DM) Perform A1c test for diabetes assessment. POCT Hgb A1c 5.6 today in office. Discussed results with patient and how to prevent type II diabetes through lifestyle modifications.  - POCT HgB A1C  3. Chronic diarrhea Chronic diarrhea and bloating for eight years without specific food triggers. No red flags noted. Symptoms affect daily life. Patient reports postprandial abdominal bloating and distention. Denies fever/chills, abdominal pain, nausea/vomiting, bloody stool/melena, changes to bowel habits, urinary symptoms, and acid reflux. Physical exam with normal bowel sounds, no tenderness, organomegaly, or masses present. Discussed ordering stool sample- patient reports he has completed this in the past without any abnormal findings. Will place referral to GI for further evaluation and management.  - Ambulatory referral to Gastroenterology    Return in about 4 weeks (around 12/31/2023) for Physical with fasting labs.   Evalene Arts, FNP

## 2023-12-03 NOTE — Patient Instructions (Addendum)

## 2023-12-25 ENCOUNTER — Ambulatory Visit (INDEPENDENT_AMBULATORY_CARE_PROVIDER_SITE_OTHER): Admitting: Gastroenterology
